# Patient Record
Sex: Female | Born: 1990 | Race: Black or African American | Hispanic: No | Marital: Single | Smoking: Never smoker
Health system: Southern US, Community
[De-identification: ages and names within clinical notes are randomized; demographics above are authoritative.]

## PROBLEM LIST (undated history)

## (undated) DIAGNOSIS — Q909 Down syndrome, unspecified: Secondary | ICD-10-CM

## (undated) DIAGNOSIS — C9200 Acute myeloblastic leukemia, not having achieved remission: Secondary | ICD-10-CM

## (undated) DIAGNOSIS — R011 Cardiac murmur, unspecified: Secondary | ICD-10-CM

---
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
## 2011-07-13 LAB — CBC
HCT: 38.5
Hemoglobin: 12.7
MCHC: 32.9
MCV: 91.8
Platelets: 176
RBC: 4.2
RDW: 14.6
WBC: 4.9

## 2011-07-13 LAB — DIFFERENTIAL
Basophils Absolute: 0
Basophils Relative: 0
Eosinophils Absolute: 0
Eosinophils Relative: 0
Lymphocytes Relative: 14 — ABNORMAL LOW
Lymphs Abs: 0.7 — ABNORMAL LOW
Monocytes Absolute: 0.3
Monocytes Relative: 7
Neutro Abs: 3.9
Neutrophils Relative %: 79 — ABNORMAL HIGH

## 2011-07-13 LAB — COMPREHENSIVE METABOLIC PANEL
ALT: 17
AST: 28
Albumin: 3.9
Alkaline Phosphatase: 64
BUN: 10
CO2: 21
Calcium: 9.5
Chloride: 108
Creatinine, Ser: 0.91
Glucose, Bld: 78
Potassium: 3.7
Sodium: 139
Total Bilirubin: 0.6
Total Protein: 7.2

## 2011-07-13 LAB — POCT I-STAT 3, VENOUS BLOOD GAS (G3P V)
Acid-base deficit: 1
Bicarbonate: 23
Patient temperature: 101.9
TCO2: 24
pH, Ven: 7.41 — ABNORMAL HIGH
pO2, Ven: 53 — ABNORMAL HIGH

## 2011-07-13 LAB — CULTURE, BLOOD (ROUTINE X 2)

## 2016-06-26 ENCOUNTER — Emergency Department (HOSPITAL_COMMUNITY): Payer: Medicaid Other

## 2016-06-26 ENCOUNTER — Encounter (HOSPITAL_COMMUNITY): Payer: Self-pay | Admitting: Emergency Medicine

## 2016-06-26 ENCOUNTER — Emergency Department (HOSPITAL_COMMUNITY)
Admission: EM | Admit: 2016-06-26 | Discharge: 2016-06-26 | Disposition: A | Discharge status: Home / Self Care | Payer: Medicaid Other | Attending: Emergency Medicine | Admitting: Emergency Medicine

## 2016-06-26 DIAGNOSIS — R531 Weakness: Secondary | ICD-10-CM | POA: Diagnosis present

## 2016-06-26 DIAGNOSIS — Q909 Down syndrome, unspecified: Secondary | ICD-10-CM | POA: Insufficient documentation

## 2016-06-26 DIAGNOSIS — E162 Hypoglycemia, unspecified: Secondary | ICD-10-CM | POA: Diagnosis not present

## 2016-06-26 HISTORY — DX: Cardiac murmur, unspecified: R01.1

## 2016-06-26 HISTORY — DX: Acute myeloblastic leukemia, not having achieved remission: C92.00

## 2016-06-26 HISTORY — DX: Down syndrome, unspecified: Q90.9

## 2016-06-26 LAB — CBC
HCT: 33.5 % — ABNORMAL LOW (ref 36.0–46.0)
Hemoglobin: 10.9 g/dL — ABNORMAL LOW (ref 12.0–15.0)
MCH: 28.2 pg (ref 26.0–34.0)
MCHC: 32.5 g/dL (ref 30.0–36.0)
MCV: 86.6 fL (ref 78.0–100.0)
PLATELETS: 281 10*3/uL (ref 150–400)
RBC: 3.87 MIL/uL (ref 3.87–5.11)
RDW: 17.9 % — AB (ref 11.5–15.5)
WBC: 4.8 10*3/uL (ref 4.0–10.5)

## 2016-06-26 LAB — URINALYSIS, ROUTINE W REFLEX MICROSCOPIC
BILIRUBIN URINE: NEGATIVE
Glucose, UA: 100 mg/dL — AB
KETONES UR: NEGATIVE mg/dL
Leukocytes, UA: NEGATIVE
NITRITE: NEGATIVE
Protein, ur: NEGATIVE mg/dL
Specific Gravity, Urine: 1.029 (ref 1.005–1.030)
pH: 6 (ref 5.0–8.0)

## 2016-06-26 LAB — COMPREHENSIVE METABOLIC PANEL
ALK PHOS: 35 U/L — AB (ref 38–126)
ALT: 13 U/L — AB (ref 14–54)
AST: 17 U/L (ref 15–41)
Albumin: 3.8 g/dL (ref 3.5–5.0)
Anion gap: 5 (ref 5–15)
BILIRUBIN TOTAL: 0.4 mg/dL (ref 0.3–1.2)
BUN: 9 mg/dL (ref 6–20)
CALCIUM: 8.6 mg/dL — AB (ref 8.9–10.3)
CO2: 24 mmol/L (ref 22–32)
CREATININE: 0.75 mg/dL (ref 0.44–1.00)
Chloride: 110 mmol/L (ref 101–111)
GFR calc Af Amer: >60 mL/min (ref >60)
Glucose, Bld: 81 mg/dL (ref 65–99)
POTASSIUM: 3.7 mmol/L (ref 3.5–5.1)
Sodium: 139 mmol/L (ref 135–145)
TOTAL PROTEIN: 7.3 g/dL (ref 6.5–8.1)

## 2016-06-26 LAB — CBG MONITORING, ED
GLUCOSE-CAPILLARY: 97 mg/dL (ref 65–99)
Glucose-Capillary: 90 mg/dL (ref 65–99)

## 2016-06-26 LAB — URINE MICROSCOPIC-ADD ON

## 2016-06-26 LAB — I-STAT CG4 LACTIC ACID, ED: LACTIC ACID, VENOUS: 1.85 mmol/L (ref 0.5–1.9)

## 2016-06-26 LAB — PREGNANCY, URINE: PREG TEST UR: NEGATIVE

## 2016-06-26 MED ORDER — SODIUM CHLORIDE 0.9 % IV BOLUS (SEPSIS)
1000.0000 mL | Freq: Once | INTRAVENOUS | Status: AC
  Administered 2016-06-26: 1000 mL via INTRAVENOUS

## 2016-06-26 NOTE — ED Notes (Signed)
Family at bedside. 

## 2016-06-26 NOTE — ED Provider Notes (Signed)
[facility] DEPT Provider Note   CSN: [id number] Arrival date & time: 06/26/16  1137     History   Chief Complaint Chief Complaint  Patient presents with  . Altered Mental Status    HPI Carrie Church is a 25 y.o. female who presents with AMS. PMH significant for Down syndrome, hx of HF s/p cardiac surgery as a child, and hx of AML in remission. She is not on any medicines currently. No hx of DM. Her mother is her caretaker and provides history. She states that earlier this morning she had an acute onset of slurred speech and generalized weakness. No syncope or fall however she was unable to stand without assistance and was "foaming at the mouth". She then had an episode of urinary incontinence. When her mother went to change her she also noted that she is currently on her period and she had several dime sized clots in her underwear. Mother denies recent illness, rigors, SOB, cough, abdominal pain, N/V. When asked, patient denies pain. Mom feels that patient is back to her baseline.  HPI  Past Medical History:  Diagnosis Date  . Down syndrome     There are no active problems to display for this patient.   History reviewed. No pertinent surgical history.  OB History    No data available       Home Medications    Prior to Admission medications   Not on File    Family History History reviewed. No pertinent family history.  Social History Social History  Substance Use Topics  . Smoking status: Never Smoker  . Smokeless tobacco: Never Used  . Alcohol use No     Allergies   Sulfa antibiotics and Sulfonylureas   Review of Systems Review of Systems  Unable to perform ROS: Other (Down syndrome)     Physical Exam Updated Vital Signs BP 113/88   Pulse 65   Temp 97.3 F (36.3 C)   Resp 12   Ht 5\' 8"  (1.727 m)   Wt 56.7 kg   LMP 06/24/2016   SpO2 96%   BMI 19.01 kg/m   Physical Exam  Constitutional: She is oriented to person, place, and time. She  appears well-developed and well-nourished. No distress.  Down facies  HENT:  Head: Normocephalic and atraumatic.  Eyes: Conjunctivae are normal. Pupils are equal, round, and reactive to light. Right eye exhibits no discharge. Left eye exhibits no discharge. No scleral icterus.  Neck: Normal range of motion. Neck supple.  Cardiovascular: Normal rate and regular rhythm.  Exam reveals no gallop and no friction rub.   Murmur heard. Loud holosystolic murmur heard throughout the precordium and most prominent in the aortic area  Pulmonary/Chest: Effort normal and breath sounds normal. No respiratory distress.  Abdominal: Soft. She exhibits no distension. There is no tenderness.  Musculoskeletal: She exhibits no edema.  Neurological: She is alert and oriented to person, place, and time.  Skin: Skin is warm and dry.  Psychiatric: She has a normal mood and affect. Her behavior is normal.  Nursing note and vitals reviewed.    ED Treatments / Results  Labs (all labs ordered are listed, but only abnormal results are displayed) Labs Reviewed  COMPREHENSIVE METABOLIC PANEL - Abnormal; Notable for the following:       Result Value   Calcium 8.6 (*)    ALT 13 (*)    Alkaline Phosphatase 35 (*)    All other components within normal limits  CBC - Abnormal;  Notable for the following:    Hemoglobin 10.9 (*)    HCT 33.5 (*)    RDW 17.9 (*)    All other components within normal limits  URINALYSIS, ROUTINE W REFLEX MICROSCOPIC (NOT AT [facility]) - Abnormal; Notable for the following:    Color, Urine AMBER (*)    APPearance CLOUDY (*)    Glucose, UA 100 (*)    Hgb urine dipstick LARGE (*)    All other components within normal limits  URINE MICROSCOPIC-ADD ON - Abnormal; Notable for the following:    Squamous Epithelial / LPF 0-5 (*)    Bacteria, UA RARE (*)    All other components within normal limits  URINE CULTURE  PREGNANCY, URINE  CBG MONITORING, ED  CBG MONITORING, ED  I-STAT CG4 LACTIC ACID,  ED  I-STAT CG4 LACTIC ACID, ED    EKG  EKG Interpretation None       Radiology Dg Chest 2 View  Result Date: 06/26/2016 CLINICAL DATA:  AMS earlier today, resolved with medication at home; no current complaints; no chest complaints; hx heart murmur with open heart surgery at 3 months of age. Non smoker EXAM: CHEST  2 VIEW COMPARISON:  07/14/2008 FINDINGS: Mild enlargement of the cardiac silhouette new since the prior study. Normal mediastinal and hilar contours. Clear lungs.  No pleural effusion or pneumothorax. Skeletal structures are unremarkable. IMPRESSION: 1. No acute cardiopulmonary disease. 2. Mild cardiomegaly. Electronically Signed   By: Lajean Manes M.D.   On: 06/26/2016 15:33    Procedures Procedures (including critical care time)  Medications Ordered in ED Medications  sodium chloride 0.9 % bolus 1,000 mL (1,000 mLs Intravenous New Bag/Given 06/26/16 1422)     Initial Impression / Assessment and Plan / ED Course  I have reviewed the triage vital signs and the nursing notes.  Pertinent labs & imaging results that were available during my care of the patient were reviewed by me and considered in my medical decision making (see chart for details).  Clinical Course   25 year old female presents with hypoglycemia which has resolved while in the ED. Unclear etiology. Patient is afebrile, not tachycardic or tachypneic, normotensive, and not hypoxic.  CBC remarkable for anemia when compared to last. CMP unremarkable. UA remarkable for 100 glucose in urine and hgb which is likely a contaminant due to patient currently being on her menstrual cycle. Lactic acid is normal. CBG monitoring has remained in the 90s. Urine preg negative. CXR negative. Will d/c with close follow with PCP. Mother verbalized understand. Patient feels improved and is at baseline.  Final Clinical Impressions(s) / ED Diagnoses   Final diagnoses:  Hypoglycemia    New Prescriptions New Prescriptions    No medications on file     Recardo Evangelist, PA-C 06/26/16 [place], DO 06/30/16 1648

## 2016-06-26 NOTE — ED Triage Notes (Signed)
Per EMS pt mother complaint pt AMS. EMS reports initial CBG 58. Pt given D50. Mother reports pt acting more per normal post medication administration. Pt verbalizes name and follows commands with triage. CBG just PTA with EMS 148.

## 2016-06-26 NOTE — ED Notes (Signed)
Bed: [id number] Expected date:  Expected time:  Means of arrival:  Comments: hypoglycemia

## 2016-06-27 LAB — URINE CULTURE

## 2018-02-03 IMAGING — CR DG CHEST 2V
2 series · 2 of 2 positions shown · non-contrast
Comparison: 07/14/2008

CLINICAL DATA: AMS earlier today, resolved with medication at home;
no current complaints; no chest complaints; hx heart murmur with
open heart surgery at 4 months of age. Non smoker

EXAM:
CHEST  2 VIEW

[w chest lat]
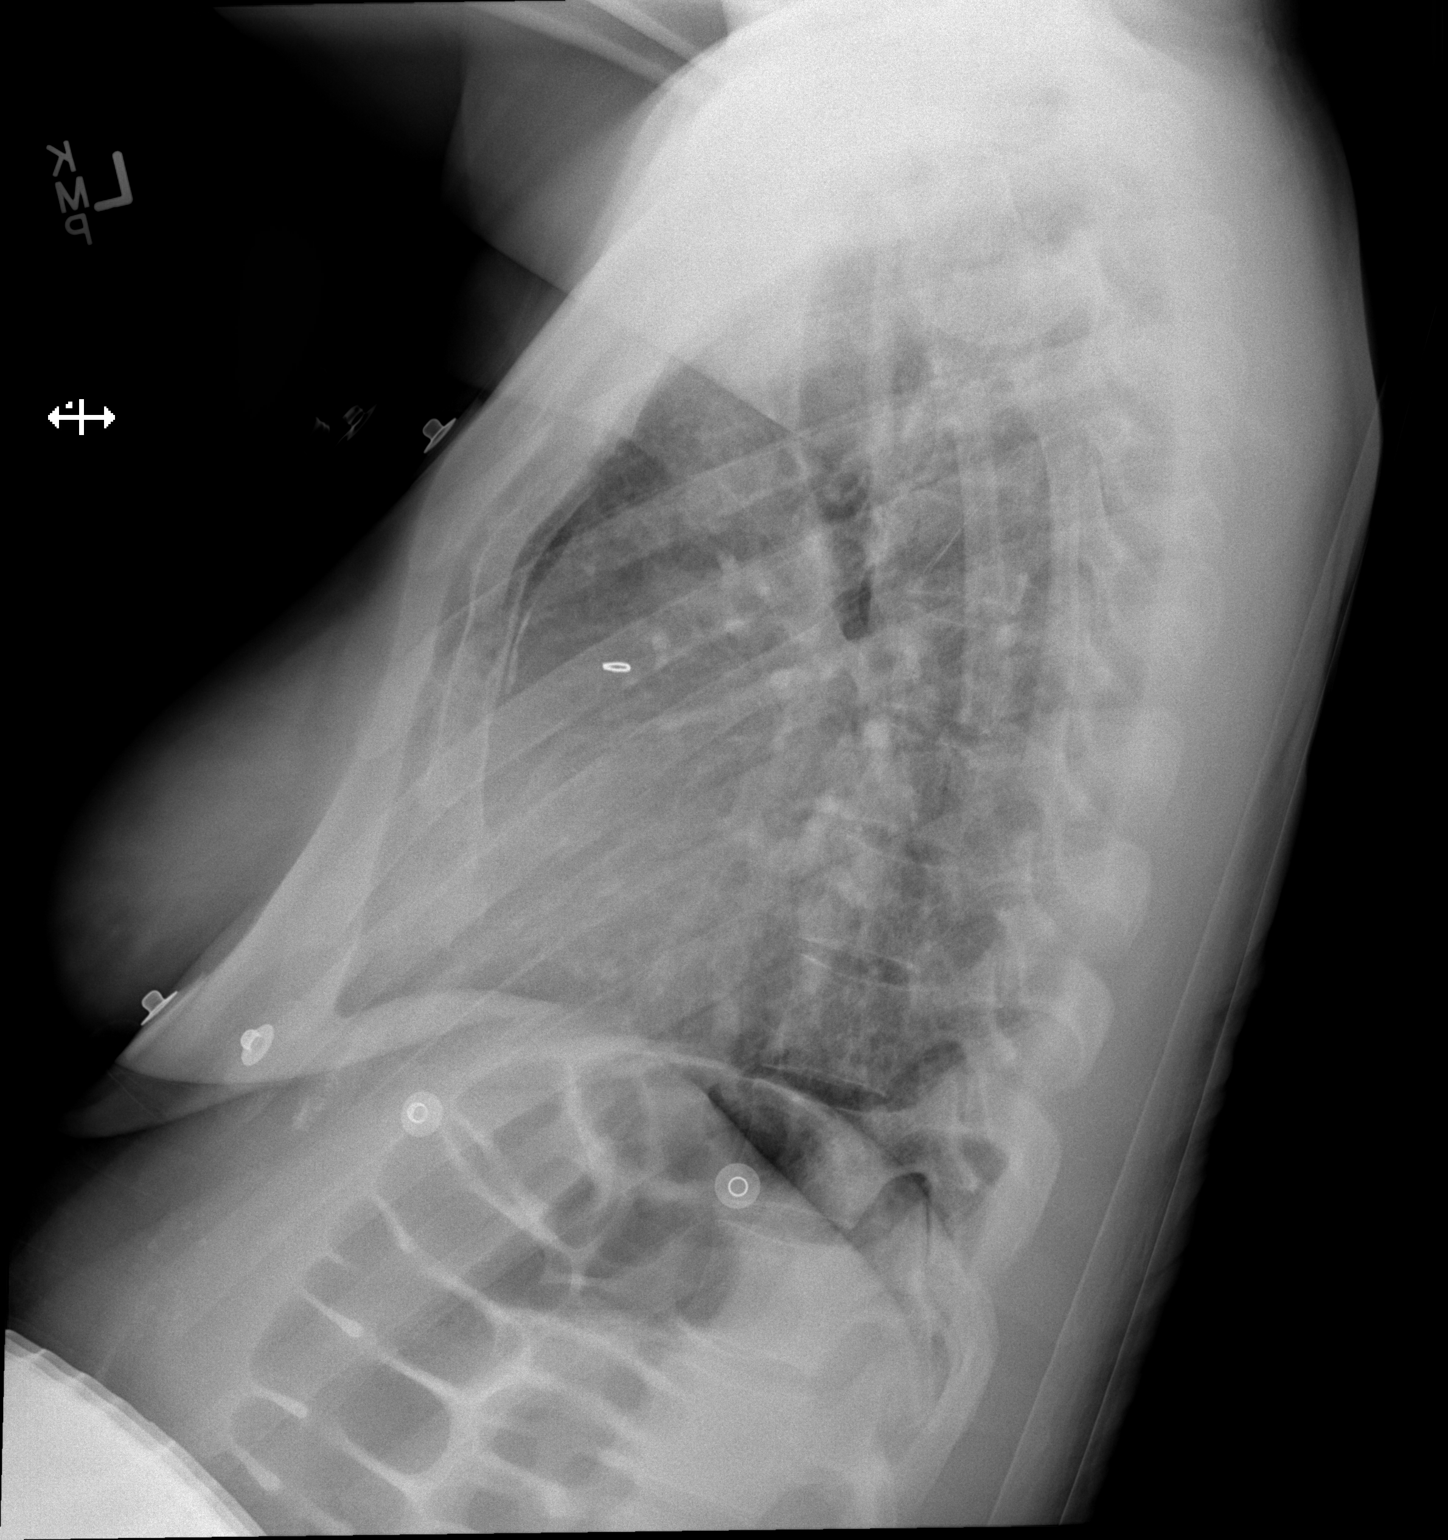

[x chest ap]
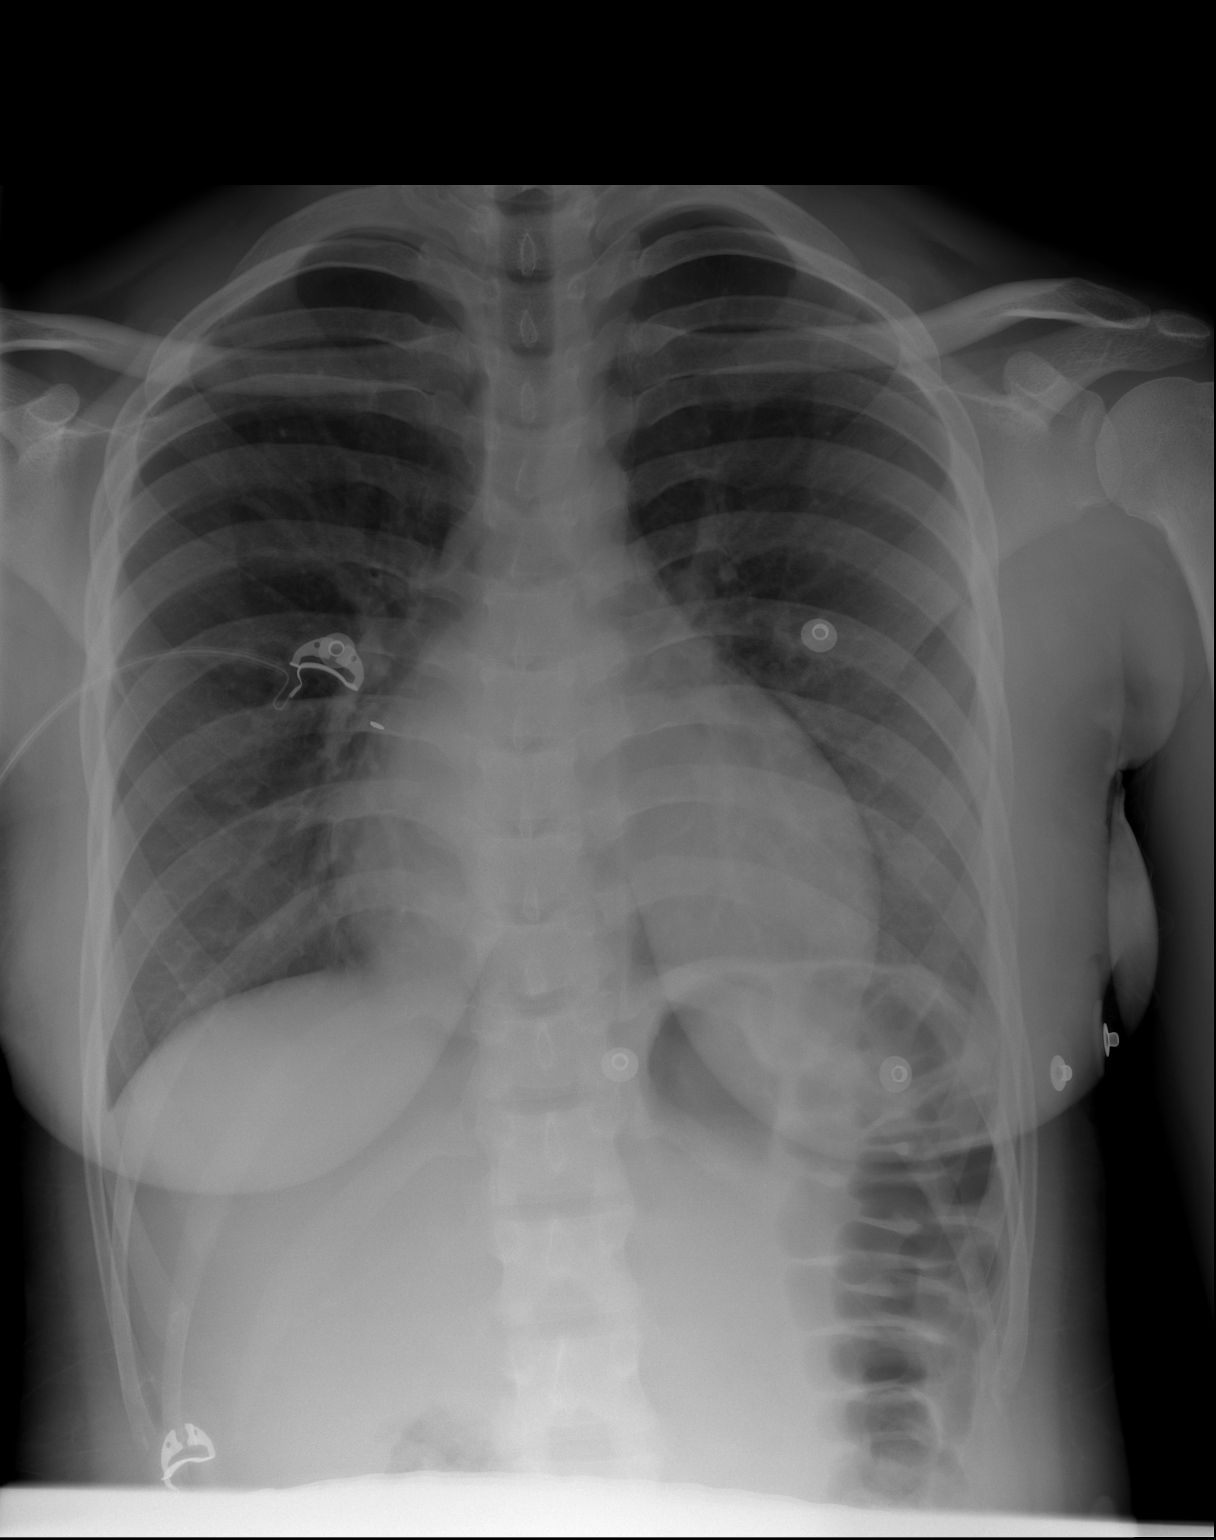

[2 of 2 positions shown; findings below may reference images not displayed]

FINDINGS: Mild enlargement of the cardiac silhouette new since the prior
study. Normal mediastinal and hilar contours.

Clear lungs.  No pleural effusion or pneumothorax.

Skeletal structures are unremarkable.
IMPRESSION: 1. No acute cardiopulmonary disease.
2. Mild cardiomegaly.

## 2024-02-07 ENCOUNTER — Encounter: Payer: Self-pay | Admitting: Neurology

## 2024-02-08 ENCOUNTER — Ambulatory Visit: Payer: MEDICAID | Admitting: Neurology

## 2024-02-08 ENCOUNTER — Encounter: Payer: Self-pay | Admitting: Neurology

## 2024-02-08 VITALS — BP 128/56 | HR 77 | Ht 59.0 in | Wt 145.0 lb

## 2024-02-08 DIAGNOSIS — R569 Unspecified convulsions: Secondary | ICD-10-CM | POA: Diagnosis not present

## 2024-02-08 MED ORDER — LEVETIRACETAM 250 MG PO TABS: 250.0000 mg | ORAL_TABLET | Freq: Two times a day (BID) | ORAL | 6 refills | Status: DC

## 2024-02-08 NOTE — Patient Instructions (Addendum)
 Start levetiracetam 250 mg twice daily Routine EEG, if normal will proceed with a 3-day ambulatory EEG Continue to follow with PCP Return in 6 months or sooner if worse.

## 2024-02-08 NOTE — Progress Notes (Signed)
 [facility]  PATIENT: Carrie Church DOB: 1991/02/27  REQUESTING CLINICIAN: Ave Bobo, MD HISTORY FROM: Mother  REASON FOR VISIT: Seizure like events    HISTORICAL  CHIEF COMPLAINT:  Chief Complaint  Patient presents with   New Patient (Initial Visit)    Rm13, alone, referral for Starring spells, with lethargy and urinary incontinence Ave Bobo MD: mom stated goes from staring to fainting and them collects herself then has urinary incontinence afterwards    HISTORY OF PRESENT ILLNESS:  This is a 33 year old woman with past medical history of Down syndrome who is presenting with event concerning for seizures, event described as unresponsiveness, body stiffness, followed by urinary incontinence and crying spells.  The first event it started in 2017, associated with foaming at the mouth.  From there she will have 1 event a year but in 2023, they increased to 2 events, 2024 3 events and so far she had 1 event in 2025.  They deny any injury following this events because mother will always help her to the floor.  Mother feels like if she does not hold her, patient will fall.  Initially they thought this was related to low sugar but she continued to experience the events even with a normal glucose level.    Handedness: Right handed   Onset: 2017  Seizure Type: Staring spell, unresponsive, body stiffness and urinary incontinence and crying spells   Current frequency: 3 spells in 2024 and one so far in 2025  Any injuries from seizures: Denies   Seizure risk factors: Down syndrome otherwise no other risk factor noted   Previous ASMs: None   Currenty ASMs: None   ASMs side effects: N/A  Brain Images: Not available for review   Previous EEGs: Not available to review    OTHER MEDICAL CONDITIONS: Down syndrome   REVIEW OF SYSTEMS: Full 14 system review of systems performed and negative with exception of: As noted in the HPI   ALLERGIES: Allergies   Allergen Reactions   Sulfa Antibiotics    Sulfonylureas     HOME MEDICATIONS: Outpatient Medications Prior to Visit  Medication Sig Dispense Refill   Cholecalciferol 1.25 MG (50000 UT) capsule Take 1 capsule by mouth once a week.     No facility-administered medications prior to visit.    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 No pertinent surgical history.  FAMILY HISTORY: Family History  Problem Relation Age of Onset   Hypertension Maternal Grandmother    Diabetes Maternal Grandmother     SOCIAL HISTORY: Social History   Socioeconomic History   Marital status: Single    Spouse name: Not on file   Number of children: Not on file   Years of education: Not on file   Highest education level: Not on file  Occupational History   Not on file  Tobacco Use   Smoking status: Never   Smokeless tobacco: Never  Substance and Sexual Activity   Alcohol use: No   Drug use: No   Sexual activity: Never  Other Topics Concern   Not on file  Social History Narrative   Not on file   Social Drivers of Health   Financial Resource Strain: Not on File (09/20/2022)   Received from [employer]  Financial Resource Strain: 0  Food Insecurity: Not at Risk (12/08/2023)   Received from [employer]    Food: 1  Transportation Needs: Not at Risk (12/08/2023)   Received from [employer] [facility]    Transportation: 1  Physical Activity: Not on File (09/20/2022)   [facility]   Physical Activity    Physical Activity: 0  Stress: Not on File (09/20/2022)   Received from [facility]   Stress    Stress: 0  Social Connections: Not on File (06/21/2023)   Received from [employer]   Social Connections    Connectedness: 0  Intimate Partner Violence: Not on file     PHYSICAL EXAM  GENERAL EXAM/CONSTITUTIONAL: Vitals:  Vitals:   02/08/24 0837  BP: (!) 128/56  Pulse: 77  Weight: 145 lb (65.8 kg)  Height: 4\' 11"  (1.499 m)   Body mass index is 29.29 kg/m. Wt Readings from Last 3 Encounters:  02/08/24 145 lb (65.8 kg)  06/26/16 125 lb (56.7 kg)   Patient is in no distress; well developed, nourished and groomed; neck is supple  MUSCULOSKELETAL: Gait, strength, tone, movements noted in Neurologic exam below  NEUROLOGIC: MENTAL STATUS:      No data to display         awake, alert, no verbal response but able lift both arms up and able to give me a thumbs up. . Moving all extremities at leas antigravity  Playing with her ipad  Normal gait    DIAGNOSTIC DATA (LABS, IMAGING, TESTING) - I reviewed patient records, labs, notes, testing and imaging myself where available.  Lab Results  Component Value Date   WBC 4.8 06/26/2016   HGB 10.9 (L) 06/26/2016   HCT 33.5 (L) 06/26/2016   MCV 86.6 06/26/2016   PLT 281 06/26/2016      Component Value Date/Time   NA 139 06/26/2016 1215   K 3.7 06/26/2016 1215   CL 110 06/26/2016 1215   CO2 24 06/26/2016 1215   GLUCOSE 81 06/26/2016 1215   BUN 9 06/26/2016 1215   CREATININE 0.75 06/26/2016 1215   CALCIUM 8.6 (L) 06/26/2016 1215   PROT 7.3 06/26/2016 1215   ALBUMIN 3.8 06/26/2016 1215   AST 17 06/26/2016 1215   ALT 13 (L) 06/26/2016 1215   ALKPHOS 35 (L) 06/26/2016 1215   BILITOT 0.4 06/26/2016 1215   GFRNONAA >60 06/26/2016 1215   GFRAA >60 06/26/2016 1215   No results found for: "CHOL", "HDL", "LDLCALC", "LDLDIRECT", "TRIG" No results found for: "HGBA1C" No results found for: "VITAMINB12" No results found for: "TSH"     ASSESSMENT AND PLAN  33 y.o. year old female  with history of Down syndrome, heart murmur who is presenting with events concerning for seizures, event described as staring spell, body stiffness, foaming at the mouth, followed by urinary incontinence and  crying spell.  Plan will be to obtain routine EEG, and if normal will obtain an ambulatory EEG.  She does have risk factor for seizure including Down syndrome but due to compelling history, I will start the patient on levetiracetam 250 mg twice daily.  I have advised her mother to monitor her events and side effects of the medication.  I will see him in 6 months for follow-up or sooner if worse.   1. Seizures (HCC)     Patient Instructions  Start levetiracetam 250 mg twice daily Routine EEG, if normal will proceed with a 3-day ambulatory EEG Continue to follow with PCP  Return in 6 months or sooner if worse.   Per [place]  DMV statutes, patients with seizures are not allowed to drive until they have been seizure-free for six months.  Other recommendations include using caution when using heavy equipment or power tools. Avoid working on ladders or at heights. Take showers instead of baths.  Do not swim alone.  Ensure the water temperature is not too high on the home water heater. Do not go swimming alone. Do not lock yourself in a room alone (i.e. bathroom). When caring for infants or small children, sit down when holding, feeding, or changing them to minimize risk of injury to the child in the event you have a seizure. Maintain good sleep hygiene. Avoid alcohol.  Also recommend adequate sleep, hydration, good diet and minimize stress.   During the Seizure  - First, ensure adequate ventilation and place patients on the floor on their left side  Loosen clothing around the neck and ensure the airway is patent. If the patient is clenching the teeth, do not force the mouth open with any object as this can cause severe damage - Remove all items from the surrounding that can be hazardous. The patient may be oblivious to what's happening and may not even know what he or she is doing. If the patient is confused and wandering, either gently guide him/her away and block access to outside areas -  Reassure the individual and be comforting - Call 911. In most cases, the seizure ends before EMS arrives. However, there are cases when seizures may last over 3 to 5 minutes. Or the individual may have developed breathing difficulties or severe injuries. If a pregnant patient or a person with diabetes develops a seizure, it is prudent to call an ambulance. - Finally, if the patient does not regain full consciousness, then call EMS. Most patients will remain confused for about 45 to 90 minutes after a seizure, so you must use judgment in calling for help. - Avoid restraints but make sure the patient is in a bed with padded side rails - Place the individual in a lateral position with the neck slightly flexed; this will help the saliva drain from the mouth and prevent the tongue from falling backward - Remove all nearby furniture and other hazards from the area - Provide verbal assurance as the individual is regaining consciousness - Provide the patient with privacy if possible - Call for help and start treatment as ordered by the caregiver   After the Seizure (Postictal Stage)  After a seizure, most patients experience confusion, fatigue, muscle pain and/or a headache. Thus, one should permit the individual to sleep. For the next few days, reassurance is essential. Being calm and helping reorient the person is also of importance.  Most seizures are painless and end spontaneously. Seizures are not harmful to others but can lead to complications such as stress on the lungs, brain and the heart. Individuals with prior lung problems may develop labored breathing and respiratory distress.    Discussed Patients with epilepsy have a small risk of sudden unexpected death, a condition referred to as sudden unexpected death in epilepsy (SUDEP). SUDEP is defined specifically as the sudden, unexpected, witnessed or unwitnessed, nontraumatic and nondrowning death in patients with epilepsy with or without evidence  for a seizure, and excluding documented status epilepticus, in which post mortem examination does not reveal a structural or toxicologic cause for death     Orders Placed This Encounter  Procedures   EEG  adult    Meds ordered this encounter  Medications   levETIRAcetam (KEPPRA) 250 MG tablet    Sig: Take 1 tablet (250 mg total) by mouth 2 (two) times daily.    Dispense:  60 tablet    Refill:  6    Return in about 6 months (around 08/09/2024).    Cassandra Cleveland, MD 02/08/2024, 9:13 AM  [facility] [address] [place], [place] [address] [phone]

## 2024-02-16 ENCOUNTER — Ambulatory Visit (INDEPENDENT_AMBULATORY_CARE_PROVIDER_SITE_OTHER): Payer: MEDICAID | Admitting: Neurology

## 2024-02-16 DIAGNOSIS — R569 Unspecified convulsions: Secondary | ICD-10-CM

## 2024-02-18 ENCOUNTER — Other Ambulatory Visit: Payer: Self-pay | Admitting: Neurology

## 2024-02-18 DIAGNOSIS — R569 Unspecified convulsions: Secondary | ICD-10-CM

## 2024-02-18 NOTE — Procedures (Signed)
   History:  33 year old woman with down syndrome and seizure  EEG classification:  Awake and asleep  Duration: 25 minutes   Technical aspects: This EEG study was done with scalp electrodes positioned according to the 10-20 International system of electrode placement. Electrical activity was reviewed with band pass filter of 1-70Hz , sensitivity of 7 uV/mm, display speed of 53mm/sec with a 60Hz  notched filter applied as appropriate. EEG data were recorded continuously and digitally stored.   Description of the recording: The background rhythms of this recording consists of a fairly well modulated medium amplitude background activity of 8-9 Hz at best. As the record progresses, the patient initially is in the waking state, but appears to enter the early stage II sleep during the recording, with rudimentary sleep spindles and vertex sharp wave activity seen. During the wakeful state, photic stimulation was performed, and no abnormal responses were seen. Hyperventilation was also performed, no abnormal response seen. No epileptiform discharges seen during this recording. There was intermittent diffuse slowing.   Abnormality: Intermittent diffuse slowing   Impression: This is an abnormal awake and sleep EEG due to intermittent diffuse slowing. This consistent with a generalized brain dysfunction, such as encephalopathy, nonspecific etiology.     Tomara Youngberg, MD [facility]

## 2024-02-18 NOTE — Progress Notes (Signed)
 Please contact mother and inform her that EEG (Brain wave test) did not show seizure, but it showed intermittent slowing. We will proceed with the 3 days ambulatory EEG. Please keep any upcoming appointments or tests and  call us  with any interim questions, concerns, problems or updates. Thanks,   Cassandra Cleveland, MD

## 2024-02-18 NOTE — Progress Notes (Signed)
 Please send amb eeg order to [facility]. Thanks

## 2024-02-20 ENCOUNTER — Telehealth: Payer: Self-pay

## 2024-02-20 NOTE — Telephone Encounter (Signed)
-----   Message from [facility] sent at 02/18/2024 10:02 AM EDT ----- Please contact mother and inform her that EEG (Brain wave test) did not show seizure, but it showed intermittent slowing. We will proceed with the 3 days ambulatory EEG. Please keep any upcoming appointments or tests and  call us  with any interim questions, concerns, problems or updates. Thanks,   Cassandra Cleveland, MD

## 2024-02-20 NOTE — Telephone Encounter (Signed)
 Patient returned phone call, transferred to POD2

## 2024-02-21 ENCOUNTER — Telehealth: Payer: Self-pay

## 2024-02-21 NOTE — Telephone Encounter (Signed)
 Video order form emailed to [facility]:

## 2024-04-02 ENCOUNTER — Telehealth: Payer: Self-pay | Admitting: Neurology

## 2024-04-02 NOTE — Telephone Encounter (Signed)
 Unable to leave msg. The hold up was my fault I had thought I sent to [facility] but was still in drafts folder of my email but did officially send today

## 2024-04-02 NOTE — Telephone Encounter (Signed)
 Pt mother called in regards to setting up an ay home sleep study for pt . Mother stated no one reached out about next steps for daughter . Pt mother is concern .

## 2024-04-03 NOTE — Telephone Encounter (Signed)
 PHONE ROOM: Please let pt know they should be reaching out soon but won't be the local area code of [phone] so answer any weird numbers

## 2024-05-19 DIAGNOSIS — G40909 Epilepsy, unspecified, not intractable, without status epilepticus: Secondary | ICD-10-CM

## 2024-06-06 ENCOUNTER — Encounter (INDEPENDENT_AMBULATORY_CARE_PROVIDER_SITE_OTHER): Payer: MEDICAID | Admitting: Neurology

## 2024-06-06 DIAGNOSIS — R569 Unspecified convulsions: Secondary | ICD-10-CM

## 2024-06-06 NOTE — Procedures (Addendum)
 Clinical History:  This is a 33 y/o F who presents with history of Down syndrome who is presenting with event concerning for seizures, event described as unresponsiveness, body stiffness, followed by urinary incontinence and crying spells.   INTERMITTENT MONITORING with VIDEO TECHNICAL SUMMARY:  This AVEEG was performed using equipment provided by [employer] utilizing Bluetooth ( [employer] ) amplifiers with continuous EEGT attended video collection using encrypted remote transmission via [employer] secured cellular tower network with data rates for each AVEEG performed. This is a Therapist, music AVEEG, obtained, according to the 10-20 international electrode placement system, reformatted digitally into referential and bipolar montages. Data was acquired with a minimum of 21 bipolar connections and sampled at a minimum rate of 250 cycles per second per channel, maximum rate of 450 cycles per second per channel and two channels for EKG. The entire VEEG study was recorded through cable and or radio telemetry for subsequent analysis. Specified epochs of the AVEEG data were identified at the direction of the subject by the depression of a push button by the patient. Each patients event file included data acquired two minutes prior to the push button activation and continuing until two minutes afterwards. AVEEG files were reviewed on [employer] server, Licensed Software provided by [employer] with a digital high frequency filter set at 70 Hz and a low frequency filter set at 1 Hz with a paper speed of 73mm/s resulting in 10 seconds per digital page. This entire AVEEG was reviewed by the EEG Technologist. Random time samples, random sleep samples, clips, patient initiated push button files with included patient daily diary logs, EEG Technologist pruned data was reviewed and verified for accuracy and validity by the governing reading neurologist in full details. This AEEGV was fully compliant  with all requirements for CPT 97500 for setup, patient education, take down and administered by an EEG technologist.   Long-Term EEG with Video was monitored intermittently by a qualified EEG technologist for the entirety of the recording; quality check-ins were performed at a minimum of every two hours, checking and documenting real-time data and video to assure the integrity and quality of the recording (e.g., camera position, electrode integrity and impedance), and identify the need for maintenance. For intermittent monitoring, an EEG Technologist monitored no more than 12 patients concurrently. Diagnostic video was captured at least 80% of the time during the recording.   PATIENT EVENTS:  There were no patient events noted or captured during this recording.   TECHNOLOGIST EVENTS:  Notes for diffuse slowing. There was electrode artifact at C4. No clear epileptiform activity was detected by the reviewing neurodiagnostic technologist for further review.   TIME SAMPLES:  10-minutes of every 2 hours recorded are reviewed as random time samples.  SLEEP SAMPLES:  5-minutes of every 24 hours recorded are reviewed as random sleep samples.   AWAKE:  At maximal level of alertness, the posterior dominant background activity was continuous, reactive, low voltage rhythm of 5-7 Hz. This was symmetric, well-modulated, and attenuated with eye opening. Diffuse, symmetric, frontocentral beta range activity was present.   SLEEP:  N1 Sleep (Stage 1) was observed and characterized by the disappearance of alpha rhythm and the appearance of vertex activity.  N2 Sleep (Stage 2) was observed and characterized by vertex waves, K-complexes, and sleep spindles.   N3 (Stage 3) sleep was observed and characterized by high amplitude Delta activity of 20%.   REM sleep was observed.   EKG: There were no arrhythmias or abnormalities noted during  this recording.   Impression:  Mild diffuse slowing   Clinical  Correlation:  This 3-day ambulatory EEG is suggestive of generalized brain dysfunction, such as encephalopathy, nonspecific etiology. There were no electrographic seizures noted. No events were captured during the recording.   Earlee Herald, MD [facility]

## 2024-06-07 ENCOUNTER — Ambulatory Visit: Payer: Self-pay | Admitting: Neurology

## 2024-06-07 ENCOUNTER — Telehealth: Payer: Self-pay

## 2024-06-07 NOTE — Progress Notes (Signed)
 Please call and advise the patient/family that the recent 3 days EEG did not show any seizures but it showed diffuse slowing as seen on the previous EEG. Please advise them to continue with Keppra  250 mg twice daily and to call us  if there are concerns for seizures. Please remind patient to keep any upcoming appointments or tests and to call us  with any interim questions, concerns, problems or updates. Thanks,   Pastor Falling, MD

## 2024-06-07 NOTE — Telephone Encounter (Signed)
 Call to mom and reviewed results, she verbalized understanding and in agreement with plan.has not started keppra  yet but will pick it up and start

## 2024-08-30 ENCOUNTER — Encounter: Payer: Self-pay | Admitting: Neurology

## 2024-08-30 ENCOUNTER — Ambulatory Visit: Payer: MEDICAID | Admitting: Neurology

## 2024-08-30 VITALS — BP 124/80 | Ht 60.0 in | Wt 150.5 lb

## 2024-08-30 DIAGNOSIS — R569 Unspecified convulsions: Secondary | ICD-10-CM

## 2024-08-30 DIAGNOSIS — Q909 Down syndrome, unspecified: Secondary | ICD-10-CM

## 2024-08-30 MED ORDER — OXCARBAZEPINE 150 MG PO TABS: 150.0000 mg | ORAL_TABLET | Freq: Two times a day (BID) | ORAL | 5 refills | Status: AC

## 2024-08-30 NOTE — Patient Instructions (Signed)
 Start Trileptal 150 mg twice daily After 1 week please decrease Keppra  to 250 mg daily for another week then stop the medication Please call for updates Follow-up in 6 months or sooner if worse

## 2024-08-30 NOTE — Progress Notes (Signed)
 [facility]  PATIENT: Carrie Church DOB: 20-Oct-1990  REQUESTING CLINICIAN: No ref. provider found HISTORY FROM: Mother  REASON FOR VISIT: Seizure like events    HISTORICAL  CHIEF COMPLAINT:  Chief Complaint  Patient presents with   RM13/SEIZURE    Pt is here with her Mother. Pt's mother states that pt hasn't had any of her spells. Pt's mother states that pt is having behavior changes.     INTERVAL HISTORY 08/30/2024 [name] presents today for follow-up, she is accompanied by her mother.  At last visit we started her on Keppra  250 mg twice daily due to high concern for seizures.  She did have both routine and ambulatory EEGs, show mild slowing but no epileptiform discharges and no seizures.  Her mother reports since starting the Keppra , she has increased irritability and some mood swings but again no seizures.   HISTORY OF PRESENT ILLNESS:  This is a 33 year old woman with past medical history of Down syndrome who is presenting with event concerning for seizures, event described as unresponsiveness, body stiffness, followed by urinary incontinence and crying spells.  The first event it started in 2017, associated with foaming at the mouth.  From there she will have 1 event a year but in 2023, they increased to 2 events, 2024 3 events and so far she had 1 event in 2025.  They deny any injury following this events because mother will always help her to the floor.  Mother feels like if she does not hold her, patient will fall.  Initially they thought this was related to low sugar but she continued to experience the events even with a normal glucose level.   Handedness: Right handed   Onset: 2017  Seizure Type: Staring spell, unresponsive, body stiffness and urinary incontinence and crying spells   Current frequency: 3 spells in 2024 and one so far in 2025  Any injuries from seizures: Denies   Seizure risk factors: Down syndrome otherwise no other risk factor  noted   Previous ASMs: None   Currenty ASMs: Levetiracetam   ASMs side effects: Mood swings and irritability  Brain Images: Not available for review   Previous EEGs: Mild slowing   OTHER MEDICAL CONDITIONS: Down syndrome   REVIEW OF SYSTEMS: Full 14 system review of systems performed and negative with exception of: As noted in the HPI   ALLERGIES: Allergies  Allergen Reactions   Sulfa Antibiotics    Sulfonylureas     HOME MEDICATIONS: Outpatient Medications Prior to Visit  Medication Sig Dispense Refill   Cholecalciferol 1.25 MG (50000 UT) capsule Take 1 capsule by mouth once a week.     levETIRAcetam  (KEPPRA ) 250 MG tablet Take 1 tablet (250 mg total) by mouth 2 (two) times daily. 60 tablet 6   No facility-administered medications prior to visit.    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 No pertinent surgical history.  FAMILY HISTORY: Family History  Problem Relation Age of Onset   Hypertension Maternal Grandmother    Diabetes Maternal Grandmother     SOCIAL HISTORY: Social History   Socioeconomic History   Marital status: Single    Spouse name: Not on file   Number of children: Not on file   Years of education: Not on file   Highest  education level: Not on file  Occupational History   Not on file  Tobacco Use   Smoking status: Never   Smokeless tobacco: Never  Substance and Sexual Activity   Alcohol use: No   Drug use: No   Sexual activity: Never  Other Topics Concern   Not on file  Social History Narrative   Not on file   Social Drivers of Health   Financial Resource Strain: Not on File (09/20/2022)   Received from [employer]    Financial Resource Strain: 0  Food Insecurity: Not at Risk (12/08/2023)   Received from [facility]  Insecurity    Within the past 12 months, the food you bought just didn't last and you didn't have enough money to get more.: 1  Transportation Needs: Not at Risk (12/08/2023)   Received from [facility] Needs    In the past 12 months, has lack of transportation kept you from medical appointments, meetings, work or from getting things needed for daily living? (Check all that apply): 1  Physical Activity: Not on File (09/20/2022)   Received from [facility]   Physical Activity    Physical Activity: 0  Stress: Not on File (09/20/2022)   Received from [facility]   Stress    Stress: 0  Social Connections: Not on File (06/21/2023)   Received from [employer]   Social Connections    Connectedness: 0  Intimate Partner Violence: Not on file    PHYSICAL EXAM  GENERAL EXAM/CONSTITUTIONAL: Vitals:  Vitals:   08/30/24 1322  BP: 124/80  Weight: 150 lb 8 oz (68.3 kg)  Height: 5' (1.524 m)   Body mass index is 29.39 kg/m. Wt Readings from Last 3 Encounters:  08/30/24 150 lb 8 oz (68.3 kg)  02/08/24 145 lb (65.8 kg)  06/26/16 125 lb (56.7 kg)   Patient is in no distress; well developed, nourished and groomed; neck is supple  MUSCULOSKELETAL: Gait, strength, tone, movements noted in Neurologic exam below  NEUROLOGIC: MENTAL STATUS:      No data to display         awake, alert, no verbal response but able lift both arms up and able to give me a thumbs up. . Moving all extremities at leas antigravity  Playing with her ipad  Normal gait    DIAGNOSTIC DATA (LABS, IMAGING, TESTING) - I reviewed patient records, labs, notes, testing and imaging myself where available.  Lab Results  Component Value Date   WBC 4.8 06/26/2016   HGB 10.9 (L) 06/26/2016   HCT 33.5 (L) 06/26/2016   MCV 86.6 06/26/2016   PLT 281 06/26/2016      Component Value Date/Time   NA 139 06/26/2016 1215   K 3.7 06/26/2016 1215   CL 110 06/26/2016 1215   CO2 24 06/26/2016 1215   GLUCOSE 81 06/26/2016 1215    BUN 9 06/26/2016 1215   CREATININE 0.75 06/26/2016 1215   CALCIUM 8.6 (L) 06/26/2016 1215   PROT 7.3 06/26/2016 1215   ALBUMIN 3.8 06/26/2016 1215   AST 17 06/26/2016 1215   ALT 13 (L) 06/26/2016 1215   ALKPHOS 35 (L) 06/26/2016 1215   BILITOT 0.4 06/26/2016 1215   GFRNONAA >60 06/26/2016 1215   GFRAA >60 06/26/2016 1215   No results found for: CHOL, HDL, LDLCALC, LDLDIRECT, TRIG No results found for: HGBA1C No results found for: VITAMINB12 No results found for: TSH  Routine EEG 02/16/2024 Intermittent diffuse slowing   Ambulatory  EEG 06/06/2024 Mild diffuse slowing    ASSESSMENT AND PLAN  33 y.o. year old female  with history of Down syndrome, heart murmur who is presenting with events concerning for seizures; events described as staring spell, body stiffness, foaming at the mouth, followed by urinary incontinence and crying spell.  Both for routine EEG and ambulatory EEG showed diffuse slowing, no epileptiform discharges and no seizures.  We started her on Keppra  250 mg twice daily but she does have side effect with irritability and mood swings.  Plan will be to switch Keppra  to Trileptal 150 mg twice daily.  I will see her in 6 months for follow-up and family understands to contact me if she does have any seizure.    1. Seizures (HCC)   2. Down syndrome      Patient Instructions  Start Trileptal 150 mg twice daily After 1 week please decrease Keppra  to 250 mg daily for another week then stop the medication Please call for updates Follow-up in 6 months or sooner if worse   Per [place] statutes, patients with seizures are not allowed to drive until they have been seizure-free for six months.  Other recommendations include using caution when using heavy equipment or power tools. Avoid working on ladders or at heights. Take showers instead of baths.  Do not swim alone.  Ensure the water temperature is not too high on the home water heater. Do not go  swimming alone. Do not lock yourself in a room alone (i.e. bathroom). When caring for infants or small children, sit down when holding, feeding, or changing them to minimize risk of injury to the child in the event you have a seizure. Maintain good sleep hygiene. Avoid alcohol.  Also recommend adequate sleep, hydration, good diet and minimize stress.   During the Seizure  - First, ensure adequate ventilation and place patients on the floor on their left side  Loosen clothing around the neck and ensure the airway is patent. If the patient is clenching the teeth, do not force the mouth open with any object as this can cause severe damage - Remove all items from the surrounding that can be hazardous. The patient may be oblivious to what's happening and may not even know what he or she is doing. If the patient is confused and wandering, either gently guide him/her away and block access to outside areas - Reassure the individual and be comforting - Call 911. In most cases, the seizure ends before EMS arrives. However, there are cases when seizures may last over 3 to 5 minutes. Or the individual may have developed breathing difficulties or severe injuries. If a pregnant patient or a person with diabetes develops a seizure, it is prudent to call an ambulance. - Finally, if the patient does not regain full consciousness, then call EMS. Most patients will remain confused for about 45 to 90 minutes after a seizure, so you must use judgment in calling for help. - Avoid restraints but make sure the patient is in a bed with padded side rails - Place the individual in a lateral position with the neck slightly flexed; this will help the saliva drain from the mouth and prevent the tongue from falling backward - Remove all nearby furniture and other hazards from the area - Provide verbal assurance as the individual is regaining consciousness - Provide the patient with privacy if possible - Call for help and start  treatment as ordered by the caregiver   After the  Seizure (Postictal Stage)  After a seizure, most patients experience confusion, fatigue, muscle pain and/or a headache. Thus, one should permit the individual to sleep. For the next few days, reassurance is essential. Being calm and helping reorient the person is also of importance.  Most seizures are painless and end spontaneously. Seizures are not harmful to others but can lead to complications such as stress on the lungs, brain and the heart. Individuals with prior lung problems may develop labored breathing and respiratory distress.    Discussed Patients with epilepsy have a small risk of sudden unexpected death, a condition referred to as sudden unexpected death in epilepsy (SUDEP). SUDEP is defined specifically as the sudden, unexpected, witnessed or unwitnessed, nontraumatic and nondrowning death in patients with epilepsy with or without evidence for a seizure, and excluding documented status epilepticus, in which post mortem examination does not reveal a structural or toxicologic cause for death     No orders of the defined types were placed in this encounter.   Meds ordered this encounter  Medications   OXcarbazepine  (TRILEPTAL ) 150 MG tablet    Sig: Take 1 tablet (150 mg total) by mouth 2 (two) times daily.    Dispense:  60 tablet    Refill:  5    Return in about 6 months (around 02/27/2025).    Pastor Falling, MD 08/30/2024, 2:04 PM  [facility] [address], [address] [place], [place] [address] [phone]

## 2025-04-02 ENCOUNTER — Ambulatory Visit: Payer: MEDICAID | Admitting: Neurology
